# Patient Record
Sex: Female | Born: 1980 | Race: White | Hispanic: No | Marital: Married | Smoking: Never smoker
Health system: Southern US, Community
[De-identification: ages and names within clinical notes are randomized; demographics above are authoritative.]

## PROBLEM LIST (undated history)

## (undated) DIAGNOSIS — J45909 Unspecified asthma, uncomplicated: Secondary | ICD-10-CM

## (undated) HISTORY — PX: TONSILLECTOMY: SUR1361

## (undated) HISTORY — PX: BREAST LUMPECTOMY: SHX2

## (undated) HISTORY — PX: APPENDECTOMY: SHX54

## (undated) HISTORY — PX: ABDOMINAL HYSTERECTOMY: SHX81

---

## 2014-06-04 ENCOUNTER — Emergency Department (HOSPITAL_BASED_OUTPATIENT_CLINIC_OR_DEPARTMENT_OTHER): Payer: Managed Care, Other (non HMO)

## 2014-06-04 ENCOUNTER — Emergency Department (HOSPITAL_BASED_OUTPATIENT_CLINIC_OR_DEPARTMENT_OTHER)
Admission: EM | Admit: 2014-06-04 | Discharge: 2014-06-04 | Disposition: A | Discharge status: Home / Self Care | Payer: Managed Care, Other (non HMO) | Attending: Emergency Medicine | Admitting: Emergency Medicine

## 2014-06-04 ENCOUNTER — Encounter (HOSPITAL_BASED_OUTPATIENT_CLINIC_OR_DEPARTMENT_OTHER): Payer: Self-pay | Admitting: *Deleted

## 2014-06-04 DIAGNOSIS — W109XXA Fall (on) (from) unspecified stairs and steps, initial encounter: Secondary | ICD-10-CM | POA: Insufficient documentation

## 2014-06-04 DIAGNOSIS — S300XXA Contusion of lower back and pelvis, initial encounter: Secondary | ICD-10-CM | POA: Insufficient documentation

## 2014-06-04 DIAGNOSIS — Y9301 Activity, walking, marching and hiking: Secondary | ICD-10-CM | POA: Diagnosis not present

## 2014-06-04 DIAGNOSIS — J45909 Unspecified asthma, uncomplicated: Secondary | ICD-10-CM | POA: Diagnosis not present

## 2014-06-04 DIAGNOSIS — S3992XA Unspecified injury of lower back, initial encounter: Secondary | ICD-10-CM | POA: Diagnosis present

## 2014-06-04 DIAGNOSIS — Y9289 Other specified places as the place of occurrence of the external cause: Secondary | ICD-10-CM | POA: Diagnosis not present

## 2014-06-04 DIAGNOSIS — W19XXXA Unspecified fall, initial encounter: Secondary | ICD-10-CM

## 2014-06-04 DIAGNOSIS — Y998 Other external cause status: Secondary | ICD-10-CM | POA: Diagnosis not present

## 2014-06-04 HISTORY — DX: Unspecified asthma, uncomplicated: J45.909

## 2014-06-04 MED ORDER — CYCLOBENZAPRINE HCL 10 MG PO TABS
5.0000 mg | ORAL_TABLET | Freq: Once | ORAL | Status: AC
  Administered 2014-06-04: 5 mg via ORAL
  Filled 2014-06-04: qty 1

## 2014-06-04 MED ORDER — OXYCODONE-ACETAMINOPHEN 5-325 MG PO TABS
2.0000 | ORAL_TABLET | Freq: Once | ORAL | Status: AC
  Administered 2014-06-04: 2 via ORAL
  Filled 2014-06-04: qty 2

## 2014-06-04 MED ORDER — NAPROXEN 500 MG PO TABS: 500.0000 mg | ORAL_TABLET | Freq: Two times a day (BID) | ORAL | Status: DC

## 2014-06-04 MED ORDER — HYDROCODONE-ACETAMINOPHEN 5-325 MG PO TABS: 2.0000 | ORAL_TABLET | ORAL | Status: DC | PRN

## 2014-06-04 MED ORDER — IBUPROFEN 800 MG PO TABS
800.0000 mg | ORAL_TABLET | Freq: Once | ORAL | Status: AC
  Administered 2014-06-04: 800 mg via ORAL
  Filled 2014-06-04: qty 1

## 2014-06-04 MED ORDER — METHOCARBAMOL 500 MG PO TABS: 500.0000 mg | ORAL_TABLET | Freq: Two times a day (BID) | ORAL | Status: DC

## 2014-06-04 NOTE — ED Notes (Signed)
States she fell down 3 wooden steps this am. C/o low mid back pain and left hip to  Lower thigh. States weight bearing hurts most. Onset this am at 0645. Has taken 1 vicodin prior to visit.

## 2014-06-04 NOTE — Discharge Instructions (Signed)
Ice 20 minutes at a time 3 times per day. Avoid ambulating and use of the left leg until your pain improves.  Contusion A contusion is a deep bruise. Contusions happen when an injury causes bleeding under the skin. Signs of bruising include pain, puffiness (swelling), and discolored skin. The contusion may turn blue, purple, or yellow. HOME CARE   Put ice on the injured area.  Put ice in a plastic bag.  Place a towel between your skin and the bag.  Leave the ice on for 15-20 minutes, 03-04 times a day.  Only take medicine as told by your doctor.  Rest the injured area.  If possible, raise (elevate) the injured area to lessen puffiness. GET HELP RIGHT AWAY IF:   You have more bruising or puffiness.  You have pain that is getting worse.  Your puffiness or pain is not helped by medicine. MAKE SURE YOU:   Understand these instructions.  Will watch your condition.  Will get help right away if you are not doing well or get worse. Document Released: 08/26/2007 Document Revised: 06/01/2011 Document Reviewed: 01/12/2011 [facility] Patient Information 2015 [employer] [place]. This information is not intended to replace advice given to you by your health care provider. Make sure you discuss any questions you have with your health care provider.

## 2014-06-04 NOTE — ED Provider Notes (Signed)
CSN: [id number]     Arrival date & time 06/04/14  0906 History   First MD Initiated Contact with Patient 06/04/14 0913     Chief Complaint  Patient presents with  . Fall  . Back Pain     HPI  Reevaluation after fall down her steps this morning. She walking. Her steps were wet. A few millimeter. She landed in a supine position. Main impact was to her left buttock. Feels pain into her low back just above her buttock and into her left leg to the mid thigh. No radicular pain to the anterior leg. No numbness weakness tingling. She is walking with crutches because of her pain. No bowel or bladder changes.  Past Medical History  Diagnosis Date  . Asthma    History reviewed. No pertinent past surgical history. No family history on file. History  Substance Use Topics  . Smoking status: Passive Smoke Exposure - Never Smoker  . Smokeless tobacco: Not on file  . Alcohol Use: Not on file   OB History    No data available     Review of Systems  Constitutional: Negative for fever, chills, diaphoresis, appetite change and fatigue.  HENT: Negative for mouth sores, sore throat and trouble swallowing.   Eyes: Negative for visual disturbance.  Respiratory: Negative for cough, chest tightness, shortness of breath and wheezing.   Cardiovascular: Negative for chest pain.  Gastrointestinal: Negative for nausea, vomiting, abdominal pain, diarrhea and abdominal distention.  Endocrine: Negative for polydipsia, polyphagia and polyuria.  Genitourinary: Negative for dysuria, frequency and hematuria.  Musculoskeletal: Positive for back pain. Negative for gait problem.  Skin: Negative for color change, pallor and rash.  Neurological: Negative for dizziness, syncope, light-headedness and headaches.  Hematological: Does not bruise/bleed easily.  Psychiatric/Behavioral: Negative for behavioral problems and confusion.      Allergies  Sulfa antibiotics  Home Medications   Prior to Admission  medications   Medication Sig Start Date End Date Taking? Authorizing Provider  HYDROcodone-acetaminophen (NORCO/VICODIN) 5-325 MG per tablet Take 2 tablets by mouth every 4 (four) hours as needed. 06/04/14   Rolland Porter, MD  methocarbamol (ROBAXIN) 500 MG tablet Take 1 tablet (500 mg total) by mouth 2 (two) times daily. 06/04/14   Rolland Porter, MD  naproxen (NAPROSYN) 500 MG tablet Take 1 tablet (500 mg total) by mouth 2 (two) times daily. 06/04/14   Rolland Porter, MD   BP 132/82 mmHg  Pulse 87  Temp(Src) 98.3 F (36.8 C) (Oral)  Resp 16  Ht  (1.626 m)  Wt 165 lb (74.844 kg)  BMI 28.31 kg/m2  SpO2 100% Physical Exam  Constitutional: She is oriented to person, place, and time. She appears well-developed and well-nourished. No distress.  HENT:  Head: Normocephalic.  Eyes: Conjunctivae are normal. Pupils are equal, round, and reactive to light. No scleral icterus.  Neck: Normal range of motion. Neck supple. No thyromegaly present.  Cardiovascular: Normal rate and regular rhythm.  Exam reveals no gallop and no friction rub.   No murmur heard. Pulmonary/Chest: Effort normal and breath sounds normal. No respiratory distress. She has no wheezes. She has no rales.  Abdominal: Soft. Bowel sounds are normal. She exhibits no distension. There is no tenderness. There is no rebound.  Musculoskeletal: Normal range of motion.       Legs: Normal range of motion. Limited by pain. Nontender to the anterior ring of the pelvis.  Neurological: She is alert and oriented to person, place, and time.  Skin:  Skin is warm and dry. No rash noted.  Psychiatric: She has a normal mood and affect. Her behavior is normal.    ED Course  Procedures (including critical care time) Labs Review Labs Reviewed - No data to display  Imaging Review Dg Lumbar Spine Complete  06/04/2014   CLINICAL DATA:  Acute left-sided lower back pain after fall on steps this morning. Initial encounter.  EXAM: LUMBAR SPINE - COMPLETE 4+  VIEW  COMPARISON:  None.  FINDINGS: There is no evidence of lumbar spine fracture. Alignment is normal. Intervertebral disc spaces are maintained.  IMPRESSION: Normal lumbar spine.   Electronically Signed   By: Lupita RaiderJames  Green Jr, M.D.   On: 06/04/2014 10:29   Dg Pelvis 1-2 Views  06/04/2014   CLINICAL DATA:  Fall this morning on steps. Left lower back pain and posterior left pelvic pain. Initial encounter.  EXAM: PELVIS - 1-2 VIEW  COMPARISON:  None.  FINDINGS: There is no evidence of pelvic fracture or diastasis. No pelvic bone lesions are seen. There is very mild superior hip joint space narrowing bilaterally. Calcifications in the pelvis likely represent phleboliths.  IMPRESSION: No acute osseous abnormality identified.   Electronically Signed   By: Sebastian AcheAllen  Grady   On: 06/04/2014 10:30     EKG Interpretation None      MDM   Final diagnoses:  Fall  Contusion, buttock, initial encounter    Reassuring studies. Pain is improving after medications. Plan is discharge home. Avoid excessive use or ambulation until the legs improving.    Rolland PorterMark Jojo Pehl, MD 06/04/14 1057

## 2018-03-28 ENCOUNTER — Emergency Department (HOSPITAL_BASED_OUTPATIENT_CLINIC_OR_DEPARTMENT_OTHER)
Admission: EM | Admit: 2018-03-28 | Discharge: 2018-03-29 | Disposition: A | Discharge status: Home / Self Care | Payer: BLUE CROSS/BLUE SHIELD | Attending: Emergency Medicine | Admitting: Emergency Medicine

## 2018-03-28 ENCOUNTER — Emergency Department (HOSPITAL_BASED_OUTPATIENT_CLINIC_OR_DEPARTMENT_OTHER): Payer: BLUE CROSS/BLUE SHIELD

## 2018-03-28 ENCOUNTER — Other Ambulatory Visit: Payer: Self-pay

## 2018-03-28 ENCOUNTER — Encounter (HOSPITAL_BASED_OUTPATIENT_CLINIC_OR_DEPARTMENT_OTHER): Payer: Self-pay | Admitting: *Deleted

## 2018-03-28 DIAGNOSIS — B9789 Other viral agents as the cause of diseases classified elsewhere: Secondary | ICD-10-CM | POA: Diagnosis not present

## 2018-03-28 DIAGNOSIS — J181 Lobar pneumonia, unspecified organism: Secondary | ICD-10-CM | POA: Diagnosis not present

## 2018-03-28 DIAGNOSIS — Z7722 Contact with and (suspected) exposure to environmental tobacco smoke (acute) (chronic): Secondary | ICD-10-CM | POA: Insufficient documentation

## 2018-03-28 DIAGNOSIS — J45909 Unspecified asthma, uncomplicated: Secondary | ICD-10-CM | POA: Insufficient documentation

## 2018-03-28 DIAGNOSIS — Z79899 Other long term (current) drug therapy: Secondary | ICD-10-CM | POA: Insufficient documentation

## 2018-03-28 DIAGNOSIS — J069 Acute upper respiratory infection, unspecified: Secondary | ICD-10-CM | POA: Diagnosis not present

## 2018-03-28 DIAGNOSIS — R509 Fever, unspecified: Secondary | ICD-10-CM | POA: Diagnosis present

## 2018-03-28 DIAGNOSIS — J189 Pneumonia, unspecified organism: Secondary | ICD-10-CM

## 2018-03-28 NOTE — ED Triage Notes (Signed)
Pt c/o URi symptoms x 2 weeks , dry cough and fever

## 2018-03-29 MED ORDER — ONDANSETRON 8 MG PO TBDP: 8.0000 mg | ORAL_TABLET | Freq: Three times a day (TID) | ORAL | 0 refills | Status: AC | PRN

## 2018-03-29 MED ORDER — DOXYCYCLINE HYCLATE 100 MG PO TABS
ORAL_TABLET | ORAL | Status: AC
  Filled 2018-03-29: qty 1

## 2018-03-29 MED ORDER — DOXYCYCLINE HYCLATE 100 MG PO CAPS: 100.0000 mg | ORAL_CAPSULE | Freq: Two times a day (BID) | ORAL | 0 refills | Status: AC

## 2018-03-29 MED ORDER — DOXYCYCLINE HYCLATE 100 MG PO TABS
100.0000 mg | ORAL_TABLET | Freq: Once | ORAL | Status: AC
  Administered 2018-03-29: 100 mg via ORAL

## 2018-03-29 MED ORDER — ONDANSETRON 8 MG PO TBDP
8.0000 mg | ORAL_TABLET | Freq: Once | ORAL | Status: AC
  Administered 2018-03-29: 8 mg via ORAL

## 2018-03-29 MED ORDER — ONDANSETRON 8 MG PO TBDP
ORAL_TABLET | ORAL | Status: AC
  Filled 2018-03-29: qty 1

## 2018-03-29 NOTE — ED Provider Notes (Signed)
[facility] Provider Note: Lowella DellJ. Lane Aldred Mase, MD, FACEP  CSN: [id number] MRN: [id number] ARRIVAL: 03/28/18 at 2310 ROOM: [facility]04/[facility]04   CHIEF COMPLAINT  URI   HISTORY OF PRESENT ILLNESS  03/29/18 12:46 AM Megan Moon is a 38 y.o. female with a one-week history of malaise, low-grade fever, nasal congestion and cough.  Yesterday she started feeling worse with weakness and nausea.  She denies shortness of breath or vomiting.  Symptoms are moderate.  She has been taking over-the-counter medications without adequate relief.   Past Medical History:  Diagnosis Date  . Asthma     Past Surgical History:  Procedure Laterality Date  . ABDOMINAL HYSTERECTOMY    . APPENDECTOMY    . BREAST LUMPECTOMY    . TONSILLECTOMY      No family history on file.  Social History   Tobacco Use  . Smoking status: Passive Smoke Exposure - Never Smoker  Substance Use Topics  . Alcohol use: Not Currently  . Drug use: Not Currently    Prior to Admission medications   Medication Sig Start Date End Date Taking? Authorizing Provider  acetaminophen (TYLENOL) 650 MG CR tablet Take 650 mg by mouth every 8 (eight) hours as needed for pain.   Yes [provider]  cetirizine (ZYRTEC) 10 MG tablet Take 10 mg by mouth daily.   Yes [provider]  guaiFENesin (MUCINEX) 600 MG 12 hr tablet Take 600 mg by mouth 2 (two) times daily.   Yes [provider]  ibuprofen (ADVIL,MOTRIN) 600 MG tablet Take 600 mg by mouth every 6 (six) hours as needed.   Yes [provider]  HYDROcodone-acetaminophen (NORCO/VICODIN) 5-325 MG per tablet Take 2 tablets by mouth every 4 (four) hours as needed. 06/04/14   Rolland PorterJames, Mark, MD  methocarbamol (ROBAXIN) 500 MG tablet Take 1 tablet (500 mg total) by mouth 2 (two) times daily. 06/04/14   Rolland PorterJames, Mark, MD  naproxen (NAPROSYN) 500 MG tablet Take 1 tablet (500 mg total) by mouth 2 (two) times daily. 06/04/14   Rolland PorterJames, Mark, MD    Allergies Sulfa  antibiotics   REVIEW OF SYSTEMS  Negative except as noted here or in the History of Present Illness.   PHYSICAL EXAMINATION  Initial Vital Signs Blood pressure 126/82, pulse 95, temperature 98.2 F (36.8 C), temperature source Oral, resp. rate 18, height 5' 4.5" (1.638 m), weight 59 kg, SpO2 100 %.  Examination General: Well-developed, well-nourished female in no acute distress; appearance consistent with age of record HENT: normocephalic; atraumatic; nasal congestion; pharynx normal Eyes: pupils equal, round and reactive to light; extraocular muscles intact Neck: supple Heart: regular rate and rhythm Lungs: clear to auscultation bilaterally Abdomen: soft; nondistended; nontender; bowel sounds present Extremities: No deformity; full range of motion Neurologic: Awake, alert and oriented; motor function intact in all extremities and symmetric; no facial droop Skin: Warm and dry Psychiatric: Normal mood and affect   RESULTS  Summary of this visit's results, reviewed by myself:   EKG Interpretation  Date/Time:    Ventricular Rate:    PR Interval:    QRS Duration:   QT Interval:    QTC Calculation:   R Axis:     Text Interpretation:        Laboratory Studies: No results found for this or any previous visit (from the past 24 hour(s)). Imaging Studies: Dg Chest 2 View  Result Date: 03/28/2018 CLINICAL DATA:  Initial evaluation for acute cough, fever. EXAM: CHEST - 2 VIEW COMPARISON:  None. FINDINGS: The cardiac and mediastinal silhouettes are stable in size and contour, and remain within normal limits. The lungs are normally inflated. Small focus of hazy opacity within the mid right lower lobe, suspicious for possible small infiltrate given provided history. Lungs are otherwise clear. No edema or effusion. No pneumothorax. No acute osseous abnormality. IMPRESSION: Small focus of hazy opacity within the mid right lower lobe, suspicious for small infiltrate given provided  history. Electronically Signed   By: Rise MuBenjamin  McClintock M.D.   On: 03/28/2018 23:42    ED COURSE and MDM  Nursing notes and initial vitals signs, including pulse oximetry, reviewed.  Vitals:   03/28/18 2313 03/28/18 2314  BP: 126/82   Pulse: 95   Resp: 18   Temp: 98.2 F (36.8 C)   TempSrc: Oral   SpO2: 100%   Weight:  59 kg  Height:  5' 4.5" (1.638 m)   History and examination consistent with viral illness and superimposed pneumonia.  She was advised to continue taking an over-the-counter cough medicine such as Mucinex DM or Robitussin-DM.  PROCEDURES    ED DIAGNOSES     ICD-10-CM   1. Community acquired pneumonia of right lower lobe of lung (HCC) J18.1   2. Viral URI with cough J06.9    B97.89        Shronda Boeh, Jonny RuizJohn, MD 03/29/18 [id number]

## 2020-02-13 ENCOUNTER — Other Ambulatory Visit: Payer: Self-pay

## 2020-02-13 ENCOUNTER — Emergency Department (HOSPITAL_BASED_OUTPATIENT_CLINIC_OR_DEPARTMENT_OTHER)
Admission: EM | Admit: 2020-02-13 | Discharge: 2020-02-13 | Disposition: A | Discharge status: Home / Self Care | Payer: Self-pay | Attending: Emergency Medicine | Admitting: Emergency Medicine

## 2020-02-13 ENCOUNTER — Encounter (HOSPITAL_BASED_OUTPATIENT_CLINIC_OR_DEPARTMENT_OTHER): Payer: Self-pay

## 2020-02-13 ENCOUNTER — Emergency Department (HOSPITAL_BASED_OUTPATIENT_CLINIC_OR_DEPARTMENT_OTHER): Payer: Self-pay

## 2020-02-13 DIAGNOSIS — S93421A Sprain of deltoid ligament of right ankle, initial encounter: Secondary | ICD-10-CM | POA: Insufficient documentation

## 2020-02-13 DIAGNOSIS — J45909 Unspecified asthma, uncomplicated: Secondary | ICD-10-CM | POA: Insufficient documentation

## 2020-02-13 DIAGNOSIS — W108XXA Fall (on) (from) other stairs and steps, initial encounter: Secondary | ICD-10-CM | POA: Insufficient documentation

## 2020-02-13 DIAGNOSIS — Y9301 Activity, walking, marching and hiking: Secondary | ICD-10-CM | POA: Insufficient documentation

## 2020-02-13 DIAGNOSIS — X501XXA Overexertion from prolonged static or awkward postures, initial encounter: Secondary | ICD-10-CM | POA: Insufficient documentation

## 2020-02-13 NOTE — Discharge Instructions (Signed)
You may take Tylenol or ibuprofen, available over-the-counter according to label instructions as needed for pain. Please follow-up with orthopedics if your pain is not improving in the next week.

## 2020-02-13 NOTE — ED Triage Notes (Signed)
Pt states she fell ~1230am-pain/swelling to right foot-limping gait

## 2020-02-13 NOTE — ED Provider Notes (Signed)
[facility] EMERGENCY DEPARTMENT Provider Note   CSN: [id number] Arrival date & time: 02/13/20  1928     History Chief Complaint  Patient presents with  . Foot Injury    Megan Moon is a 39 y.o. female.  The history is provided by the patient.  Foot Injury  Megan Moon is a 39 y.o. female who presents to the Emergency Department complaining of ankle injury. This morning when she was walking up her steps after a long shift at 1230 this morning and she tripped, and fell forward with an inversion injury to her right ankle as she fell. She complains of severe pain to the right ankle, mid foot and lateral foot. She is unable to bear weight secondary to pain. No prior similar injuries. She has no known medical problems and takes no medications. Symptoms are moderate and constant nature.    Past Medical History:  Diagnosis Date  . Asthma     There are no problems to display for this patient.   Past Surgical History:  Procedure Laterality Date  . ABDOMINAL HYSTERECTOMY    . APPENDECTOMY    . BREAST LUMPECTOMY    . TONSILLECTOMY       OB History   No obstetric history on file.     No family history on file.  Social History   Tobacco Use  . Smoking status: Never Smoker  . Smokeless tobacco: Never Used  Vaping Use  . Vaping Use: Never used  Substance Use Topics  . Alcohol use: Not Currently  . Drug use: Not Currently    Home Medications Prior to Admission medications   Medication Sig Start Date End Date Taking? Authorizing Provider  doxycycline (VIBRAMYCIN) 100 MG capsule Take 1 capsule (100 mg total) by mouth 2 (two) times daily. One po bid x 7 days 03/29/18   Molpus, John, MD  guaiFENesin (MUCINEX) 600 MG 12 hr tablet Take 600 mg by mouth 2 (two) times daily.    [provider]  ondansetron (ZOFRAN ODT) 8 MG disintegrating tablet Take 1 tablet (8 mg total) by mouth every 8 (eight) hours as needed for nausea or vomiting. 03/29/18   Molpus,  John, MD    Allergies    Sulfa antibiotics  Review of Systems   Review of Systems  All other systems reviewed and are negative.   Physical Exam Updated Vital Signs BP 108/76 (BP Location: Left Arm)   Pulse 91   Temp 98.4 F (36.9 C) (Oral)   Resp 18   Ht 5\' 4"  (1.626 m)   Wt 53.1 kg   SpO2 100%   BMI 20.08 kg/m   Physical Exam Vitals and nursing note reviewed.  Constitutional:      Appearance: She is well-developed.  HENT:     Head: Normocephalic and atraumatic.  Cardiovascular:     Rate and Rhythm: Normal rate and regular rhythm.  Pulmonary:     Effort: Pulmonary effort is normal. No respiratory distress.  Musculoskeletal:        General: Swelling and tenderness present.     Comments: 2+ DP pulses in the right foot. There is moderate soft tissue swelling and tenderness to the right lateral ankle, mid foot with local ecchymosis. Decreased range of motion secondary of pain. Brisk Refill distally.  Skin:    General: Skin is warm and dry.  Neurological:     Mental Status: She is alert and oriented to person, place, and time.  Psychiatric:  Behavior: Behavior normal.     ED Results / Procedures / Treatments   Labs (all labs ordered are listed, but only abnormal results are displayed) Labs Reviewed - No data to display  EKG None  Radiology DG Ankle Complete Right  Result Date: 02/13/2020 CLINICAL DATA:  Fall EXAM: RIGHT ANKLE - COMPLETE 3+ VIEW COMPARISON:  None. FINDINGS: There is no evidence of fracture, dislocation, or joint effusion. There is no evidence of arthropathy or other focal bone abnormality. Soft tissues are unremarkable. IMPRESSION: Negative. Electronically Signed   By: Deatra Robinson M.D.   On: 02/13/2020 20:25   DG Foot Complete Right  Result Date: 02/13/2020 CLINICAL DATA:  Fall EXAM: RIGHT FOOT COMPLETE - 3+ VIEW COMPARISON:  None. FINDINGS: There is no evidence of fracture or dislocation. There is no evidence of arthropathy or other  focal bone abnormality. Soft tissues are unremarkable. IMPRESSION: Negative. Electronically Signed   By: Deatra Robinson M.D.   On: 02/13/2020 20:24    Procedures Procedures (including critical care time)  Medications Ordered in ED Medications - No data to display  ED Course  I have reviewed the triage vital signs and the nursing notes.  Pertinent labs & imaging results that were available during my care of the patient were reviewed by me and considered in my medical decision making (see chart for details).    MDM Rules/Calculators/A&P                         Patient here for evaluation of ankle and foot pain after twisting injury that was sustained early this morning. She has soft tissue swelling and tenderness. Imaging is negative for acute fracture. Presentation is not consistent with LisFranc injury, dislocation. Discussed with patient home care for ankle sprain. Discussed outpatient follow-up and return precautions.  Final Clinical Impression(s) / ED Diagnoses Final diagnoses:  Sprain of deltoid ligament of right ankle, initial encounter    Rx / DC Orders ED Discharge Orders    None       Tilden Fossa, MD 02/13/20 2045

## 2020-02-13 NOTE — ED Notes (Signed)
Patient transported to X-ray 

## 2021-02-10 ENCOUNTER — Encounter (HOSPITAL_BASED_OUTPATIENT_CLINIC_OR_DEPARTMENT_OTHER): Payer: Self-pay

## 2021-02-10 ENCOUNTER — Emergency Department (HOSPITAL_BASED_OUTPATIENT_CLINIC_OR_DEPARTMENT_OTHER): Payer: Self-pay

## 2021-02-10 ENCOUNTER — Emergency Department (HOSPITAL_BASED_OUTPATIENT_CLINIC_OR_DEPARTMENT_OTHER)
Admission: EM | Admit: 2021-02-10 | Discharge: 2021-02-10 | Disposition: A | Discharge status: Home / Self Care | Payer: Self-pay | Attending: Emergency Medicine | Admitting: Emergency Medicine

## 2021-02-10 DIAGNOSIS — R5383 Other fatigue: Secondary | ICD-10-CM | POA: Insufficient documentation

## 2021-02-10 DIAGNOSIS — M791 Myalgia, unspecified site: Secondary | ICD-10-CM | POA: Insufficient documentation

## 2021-02-10 DIAGNOSIS — Z20822 Contact with and (suspected) exposure to covid-19: Secondary | ICD-10-CM | POA: Insufficient documentation

## 2021-02-10 DIAGNOSIS — R051 Acute cough: Secondary | ICD-10-CM | POA: Insufficient documentation

## 2021-02-10 DIAGNOSIS — R059 Cough, unspecified: Secondary | ICD-10-CM

## 2021-02-10 DIAGNOSIS — R0602 Shortness of breath: Secondary | ICD-10-CM

## 2021-02-10 DIAGNOSIS — R509 Fever, unspecified: Secondary | ICD-10-CM | POA: Insufficient documentation

## 2021-02-10 DIAGNOSIS — J45909 Unspecified asthma, uncomplicated: Secondary | ICD-10-CM | POA: Insufficient documentation

## 2021-02-10 DIAGNOSIS — R519 Headache, unspecified: Secondary | ICD-10-CM | POA: Insufficient documentation

## 2021-02-10 LAB — RESP PANEL BY RT-PCR (FLU A&B, COVID) ARPGX2
Influenza A by PCR: NEGATIVE
Influenza B by PCR: NEGATIVE
SARS Coronavirus 2 by RT PCR: NEGATIVE

## 2021-02-10 MED ORDER — IPRATROPIUM-ALBUTEROL 0.5-2.5 (3) MG/3ML IN SOLN
3.0000 mL | Freq: Once | RESPIRATORY_TRACT | Status: AC
  Administered 2021-02-10: 3 mL via RESPIRATORY_TRACT
  Filled 2021-02-10: qty 3

## 2021-02-10 MED ORDER — ALBUTEROL SULFATE HFA 108 (90 BASE) MCG/ACT IN AERS: 1.0000 | INHALATION_SPRAY | Freq: Four times a day (QID) | RESPIRATORY_TRACT | 0 refills | Status: AC | PRN

## 2021-02-10 MED ORDER — BENZONATATE 100 MG PO CAPS: 200.0000 mg | ORAL_CAPSULE | Freq: Three times a day (TID) | ORAL | 0 refills | Status: AC

## 2021-02-10 NOTE — ED Triage Notes (Signed)
Pt c/o fever, bodyaches, cough, sore throat, headache, ear pain x 2 days.

## 2021-02-10 NOTE — ED Provider Notes (Signed)
[facility] EMERGENCY DEPARTMENT Provider Note   CSN: [id number] Arrival date & time: 02/10/21  1106     History Chief Complaint  Patient presents with   Cough    Megan Moon is a 40 y.o. female who presents the emergency department with 2-day history of nonproductive dry constant severe cough.  She reports associated subjective fever up to 103 which was intermittently improved with Tylenol.  She also reports associated general malaise, myalgias, and global headache.  She denies any abdominal pain, nausea, vomiting.  Some diarrhea.  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  She does endorse a history of asthma.   Cough     Past Medical History:  Diagnosis Date   Asthma     There are no problems to display for this patient.   Past Surgical History:  Procedure Laterality Date   ABDOMINAL HYSTERECTOMY     APPENDECTOMY     BREAST LUMPECTOMY     TONSILLECTOMY       OB History   No obstetric history on file.     History reviewed. No pertinent family history.  Social History   Tobacco Use   Smoking status: Never   Smokeless tobacco: Never  Vaping Use   Vaping Use: Never used  Substance Use Topics   Alcohol use: Not Currently   Drug use: Not Currently    Home Medications Prior to Admission medications   Medication Sig Start Date End Date Taking? Authorizing Provider  albuterol (VENTOLIN HFA) 108 (90 Base) MCG/ACT inhaler Inhale 1-2 puffs into the lungs every 6 (six) hours as needed for wheezing or shortness of breath. 02/10/21  Yes Jamae Tison M, PA-C  benzonatate (TESSALON) 100 MG capsule Take 2 capsules (200 mg total) by mouth every 8 (eight) hours. 02/10/21  Yes Meredeth Ide, Jaelynn Pozo M, PA-C  doxycycline (VIBRAMYCIN) 100 MG capsule Take 1 capsule (100 mg total) by mouth 2 (two) times daily. One po bid x 7 days 03/29/18   Molpus, John, MD  guaiFENesin (MUCINEX) 600 MG 12 hr tablet Take 600 mg by mouth 2  (two) times daily.    [provider]  ondansetron (ZOFRAN ODT) 8 MG disintegrating tablet Take 1 tablet (8 mg total) by mouth every 8 (eight) hours as needed for nausea or vomiting. 03/29/18   Molpus, John, MD    Allergies    Sulfa antibiotics  Review of Systems   Review of Systems  Respiratory:  Positive for cough.   All other systems reviewed and are negative.  Physical Exam Updated Vital Signs BP 106/77 (BP Location: Left Arm)   Pulse 100   Temp 98.2 F (36.8 C) (Oral)   Resp 16   Ht 5' 4.5" (1.638 m)   Wt 54.9 kg   SpO2 100%   BMI 20.45 kg/m   Physical Exam Vitals and nursing note reviewed.  Constitutional:      General: She is not in acute distress.    Appearance: Normal appearance.  HENT:     Head: Normocephalic and atraumatic.  Eyes:     General:        Right eye: No discharge.        Left eye: No discharge.     Conjunctiva/sclera: Conjunctivae normal.  Cardiovascular:     Comments: Regular rate and rhythm.  S1/S2 are distinct without any evidence of murmur, rubs, or gallops.  Radial pulses are 2+ bilaterally.  Dorsalis pedis pulses are  2+ bilaterally.  No evidence of pedal edema. Pulmonary:     Effort: Pulmonary effort is normal.     Breath sounds: Wheezing present.  Abdominal:     General: Abdomen is flat. Bowel sounds are normal. There is no distension.     Tenderness: There is no abdominal tenderness. There is no guarding or rebound.  Musculoskeletal:        General: Normal range of motion.     Cervical back: Neck supple.  Skin:    General: Skin is warm and dry.     Findings: No rash.  Neurological:     General: No focal deficit present.     Mental Status: She is alert.  Psychiatric:        Mood and Affect: Mood normal.        Behavior: Behavior normal.    ED Results / Procedures / Treatments   Labs (all labs ordered are listed, but only abnormal results are displayed) Labs Reviewed  RESP PANEL BY RT-PCR (FLU A&B, COVID) [id number]     EKG None  Radiology DG Chest Port 1 View  Result Date: 02/10/2021 CLINICAL DATA:  Fever EXAM: PORTABLE CHEST 1 VIEW COMPARISON:  Chest x-ray 09/03/2019 FINDINGS: Heart size and mediastinal contours are within normal limits. No suspicious pulmonary opacities identified. No pleural effusion or pneumothorax visualized. No acute osseous abnormality appreciated. IMPRESSION: No acute intrathoracic process identified. Electronically Signed   By: Jannifer Hick M.D.   On: 02/10/2021 14:58    Procedures Procedures   Medications Ordered in ED Medications  ipratropium-albuterol (DUONEB) 0.5-2.5 (3) MG/3ML nebulizer solution 3 mL (3 mLs Nebulization Given 02/10/21 1435)    ED Course  I have reviewed the triage vital signs and the nursing notes.  Pertinent labs & imaging results that were available during my care of the patient were reviewed by me and considered in my medical decision making (see chart for details).    MDM Rules/Calculators/A&P                          [name] is a 40 y.o. female who presents the emergency department for for evaluation of flulike symptoms.  COVID and influenza swab were initiated in triage.  These were both negative.  On my exam she had some diffuse wheezing.  Given her high fevers and physical exam findings I will get a chest x-ray to evaluate for possible pneumonia.  We will also give her a DuoNeb treatment for the wheezing.  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  Patient fell slightly better after DuoNeb.  Vitals are still normal.  I will refill her albuterol inhaler and give her prescription for Tessalon Perles for cough.  She is safe for discharge.  Strict return precautions were given.   Final Clinical Impression(s) / ED Diagnoses Final diagnoses:  Acute cough    Rx / DC Orders ED Discharge Orders          Ordered    albuterol (VENTOLIN HFA) 108 (90 Base) MCG/ACT  inhaler  Every 6 hours PRN        02/10/21 1555    benzonatate (TESSALON) 100 MG capsule  Every 8 hours        02/10/21 1555             Honor Loh Alvan, [place] 02/10/21 1557    Terald Sleeper, MD  02/10/21 1615  

## 2021-02-10 NOTE — Discharge Instructions (Signed)
You tested negative for flu and COVID today.  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  I have written you for a new inhaler.  Also written you Tessalon Perles for cough.  You can take this 3 times per day.  Please return to the emergency department if you experience worsening cough, fever that will not go down with Tylenol or ibuprofen, trouble breathing, or any other concerns you might have.

## 2021-11-26 IMAGING — DX DG CHEST 1V PORT
1 series · 1 of 1 positions shown · non-contrast
Comparison: Chest x-ray 09/03/2019

CLINICAL DATA: Fever

EXAM:
PORTABLE CHEST 1 VIEW

[chest ap]
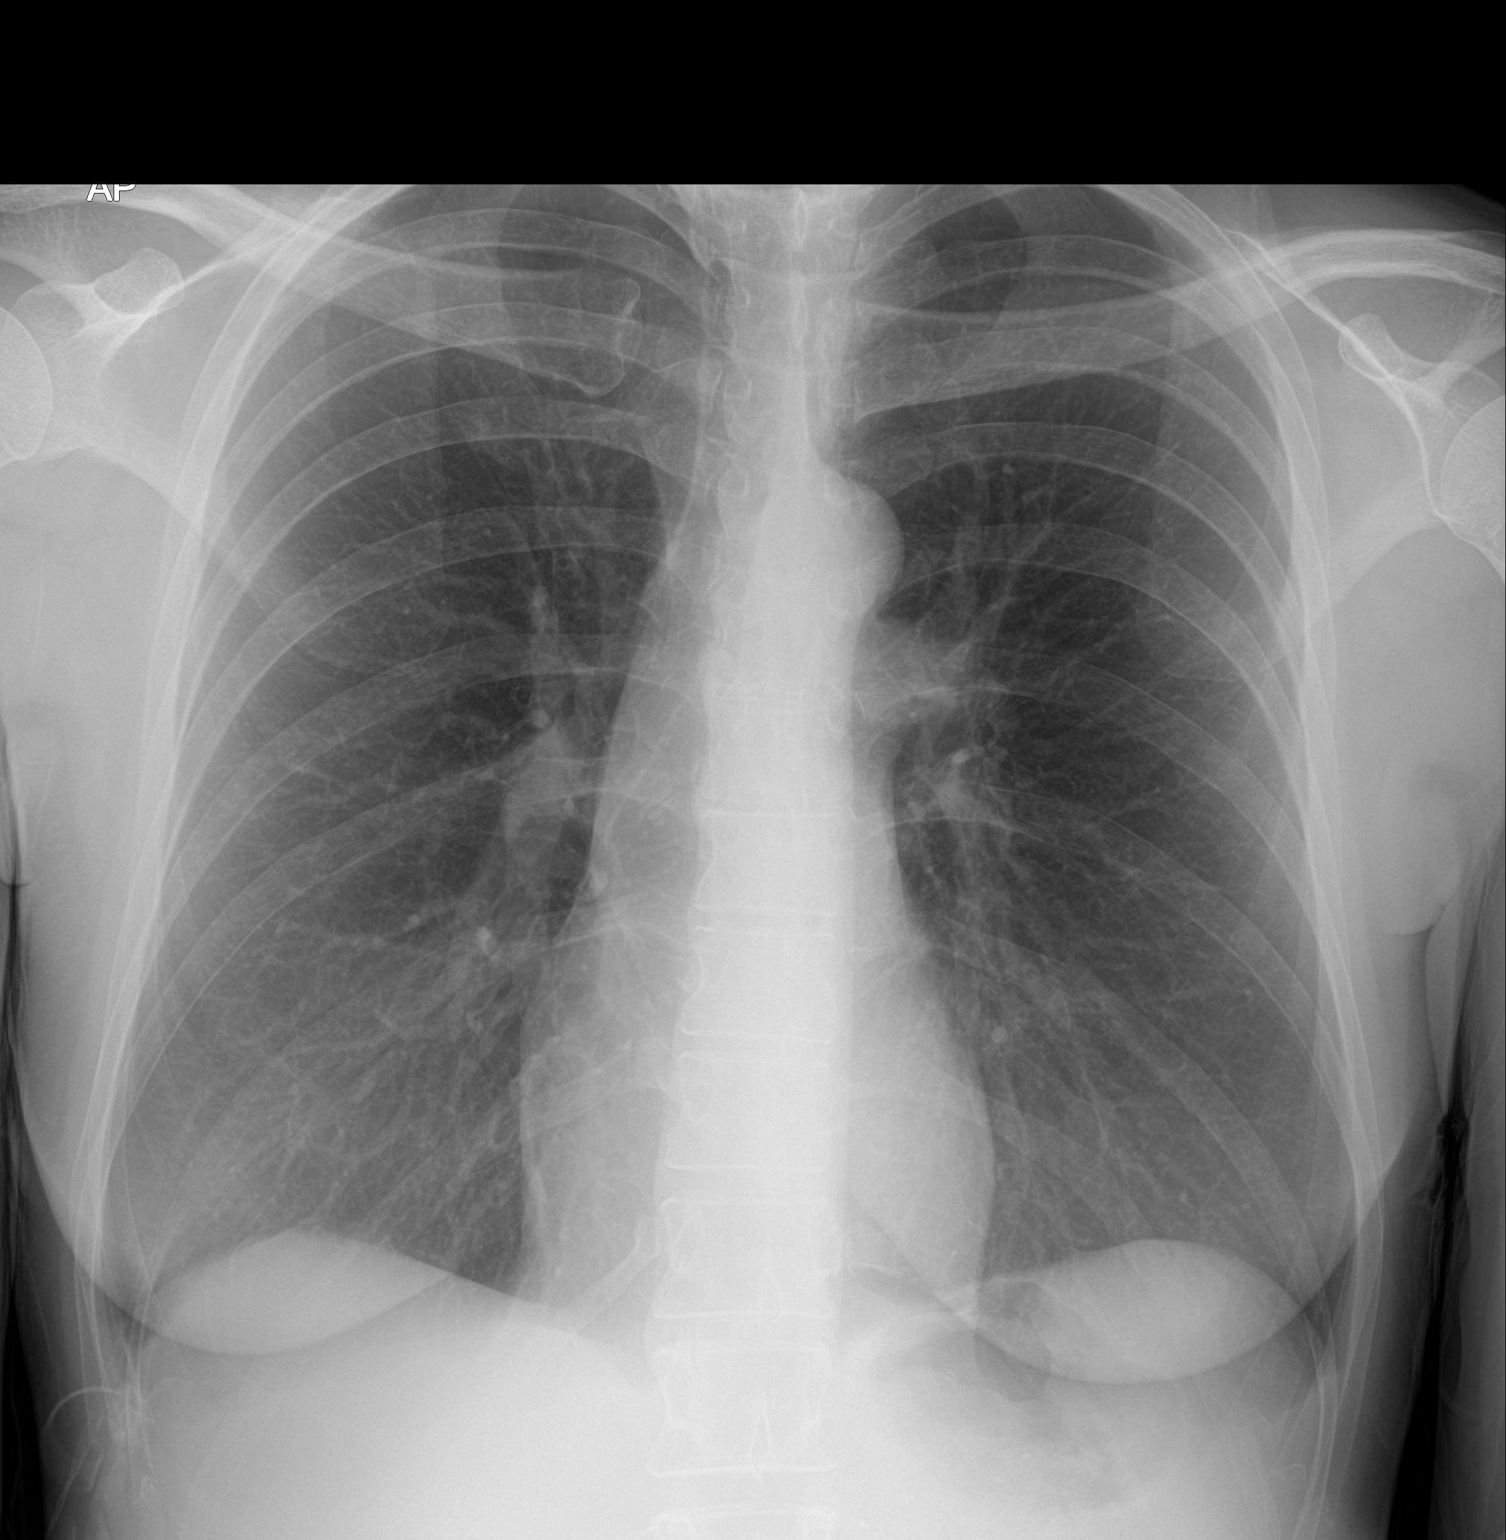

[1 of 1 positions shown; findings below may reference images not displayed]

FINDINGS: Heart size and mediastinal contours are within normal limits. No
suspicious pulmonary opacities identified.

No pleural effusion or pneumothorax visualized.

No acute osseous abnormality appreciated.
IMPRESSION: No acute intrathoracic process identified.
